# Patient Record
Sex: Female | Born: 1950 | Race: White | Hispanic: No | Marital: Single
Health system: Southern US, Community
[De-identification: ages and names within clinical notes are randomized; demographics above are authoritative.]

---

## 1991-07-30 HISTORY — PX: BREAST EXCISIONAL BIOPSY: SUR124

## 2006-02-12 ENCOUNTER — Ambulatory Visit: Payer: Self-pay

## 2006-07-28 ENCOUNTER — Emergency Department: Payer: Self-pay | Admitting: Emergency Medicine

## 2007-04-22 ENCOUNTER — Ambulatory Visit: Payer: Self-pay

## 2008-09-14 ENCOUNTER — Ambulatory Visit: Payer: Self-pay

## 2009-12-13 ENCOUNTER — Ambulatory Visit: Payer: Self-pay

## 2011-09-18 ENCOUNTER — Ambulatory Visit: Payer: Self-pay

## 2013-05-19 ENCOUNTER — Ambulatory Visit: Payer: Self-pay

## 2014-11-11 ENCOUNTER — Other Ambulatory Visit: Payer: Self-pay | Admitting: Oncology

## 2014-11-11 DIAGNOSIS — Z139 Encounter for screening, unspecified: Secondary | ICD-10-CM

## 2014-12-07 ENCOUNTER — Ambulatory Visit
Admission: RE | Admit: 2014-12-07 | Discharge: 2014-12-07 | Disposition: A | Discharge status: Home / Self Care | Payer: Self-pay | Source: Ambulatory Visit | Attending: Oncology | Admitting: Oncology

## 2014-12-07 ENCOUNTER — Inpatient Hospital Stay: Payer: Self-pay | Attending: Oncology

## 2014-12-07 VITALS — BP 149/85 | HR 73 | Temp 98.2°F | Resp 18 | Ht 65.35 in | Wt 163.6 lb

## 2014-12-07 DIAGNOSIS — Z139 Encounter for screening, unspecified: Secondary | ICD-10-CM

## 2014-12-07 DIAGNOSIS — Z0000 Encounter for general adult medical examination without abnormal findings: Secondary | ICD-10-CM

## 2014-12-07 NOTE — Progress Notes (Signed)
Subjective:     Patient ID: Meghan Becker, female   DOB: 12/05/1950, 64 y.o.   MRN: [id number]  HPI   Review of Systems     Objective:   Physical Exam  Pulmonary/Chest: Right breast exhibits no inverted nipple, no mass, no nipple discharge, no skin change and no tenderness. Left breast exhibits no inverted nipple, no mass, no nipple discharge, no skin change and no tenderness. Breasts are symmetrical.       Assessment:    64 yearold female presents for BCCCP screening.  CBE unremarkable.  Instructed on BSE using teach back.    .     Plan:       Sent for bilateral screening mammogram. Will follow per protocol.

## 2014-12-09 NOTE — Progress Notes (Signed)
Letter mailed from [facility] to notify of normal mammogram results.  Patient to return in one year for annual screening.   [facility] Breast Data sheet completed. 

## 2016-05-15 ENCOUNTER — Ambulatory Visit: Payer: Self-pay | Attending: Oncology

## 2016-05-15 ENCOUNTER — Ambulatory Visit
Admission: RE | Admit: 2016-05-15 | Discharge: 2016-05-15 | Disposition: A | Discharge status: Home / Self Care | Payer: Self-pay | Source: Ambulatory Visit | Attending: Oncology | Admitting: Oncology

## 2016-05-15 VITALS — BP 129/84 | HR 74 | Temp 97.7°F | Ht 64.57 in | Wt 156.5 lb

## 2016-05-15 DIAGNOSIS — Z0000 Encounter for general adult medical examination without abnormal findings: Secondary | ICD-10-CM

## 2016-05-15 NOTE — Progress Notes (Signed)
Subjective:     Patient ID: Meghan DageBarbara S Brozek, female   DOB: 09/02/1950, 65 y.o.   MRN: [id number]  HPI   Review of Systems     Objective:   Physical Exam  Pulmonary/Chest: Right breast exhibits no inverted nipple, no mass, no nipple discharge, no skin change and no tenderness. Left breast exhibits no inverted nipple, no mass, no nipple discharge, no skin change and no tenderness. Breasts are symmetrical.       Assessment:     65 year old patient returns for annual BCCCP screening.  Patient screened, and meets BCCCP eligibility.  Patient does not have insurance, Medicare or Medicaid.  Handout given on Affordable Care Act.  Instructed patient on breast self-exam using teach back method. Clinical breast exam normal. No mass or lump palpated. 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     Plan:     Sent for bilateral screening mammogram.

## 2016-05-16 NOTE — Progress Notes (Signed)
Letter mailed from [facility] to notify of normal mammogram results.  Patient to return in one year for annual screening.  Copy to [facility]. 

## 2017-12-08 ENCOUNTER — Other Ambulatory Visit: Payer: Self-pay | Admitting: Primary Care

## 2017-12-08 DIAGNOSIS — R112 Nausea with vomiting, unspecified: Secondary | ICD-10-CM

## 2017-12-08 DIAGNOSIS — R1084 Generalized abdominal pain: Secondary | ICD-10-CM

## 2017-12-10 ENCOUNTER — Ambulatory Visit: Payer: Self-pay

## 2017-12-17 ENCOUNTER — Ambulatory Visit
Admission: RE | Admit: 2017-12-17 | Discharge: 2017-12-17 | Disposition: A | Discharge status: Home / Self Care | Payer: Medicare Other | Source: Ambulatory Visit | Attending: Primary Care | Admitting: Primary Care

## 2017-12-17 DIAGNOSIS — R112 Nausea with vomiting, unspecified: Secondary | ICD-10-CM

## 2017-12-17 DIAGNOSIS — R1084 Generalized abdominal pain: Secondary | ICD-10-CM | POA: Diagnosis present

## 2017-12-17 DIAGNOSIS — K769 Liver disease, unspecified: Secondary | ICD-10-CM | POA: Insufficient documentation

## 2018-09-10 ENCOUNTER — Other Ambulatory Visit: Payer: Self-pay | Admitting: Primary Care

## 2018-09-10 DIAGNOSIS — Z1231 Encounter for screening mammogram for malignant neoplasm of breast: Secondary | ICD-10-CM

## 2018-09-17 ENCOUNTER — Other Ambulatory Visit: Payer: Self-pay | Admitting: Primary Care

## 2018-09-17 ENCOUNTER — Other Ambulatory Visit (HOSPITAL_COMMUNITY): Payer: Self-pay | Admitting: Primary Care

## 2018-09-17 DIAGNOSIS — R2990 Unspecified symptoms and signs involving the nervous system: Secondary | ICD-10-CM

## 2018-09-30 ENCOUNTER — Ambulatory Visit
Admission: RE | Admit: 2018-09-30 | Discharge: 2018-09-30 | Disposition: A | Discharge status: Home / Self Care | Payer: Medicare Other | Source: Ambulatory Visit | Attending: Primary Care | Admitting: Primary Care

## 2018-09-30 DIAGNOSIS — Z1231 Encounter for screening mammogram for malignant neoplasm of breast: Secondary | ICD-10-CM | POA: Diagnosis present

## 2019-02-17 ENCOUNTER — Encounter: Payer: Self-pay | Admitting: Gastroenterology

## 2019-07-16 IMAGING — MG DIGITAL SCREENING BILATERAL MAMMOGRAM WITH TOMO AND CAD
8 series · 8 of 24 positions shown · non-contrast
Comparison: Previous exam(s).

CLINICAL DATA: Screening.

EXAM:
DIGITAL SCREENING BILATERAL MAMMOGRAM WITH TOMO AND CAD

[L MLO synth-2D]
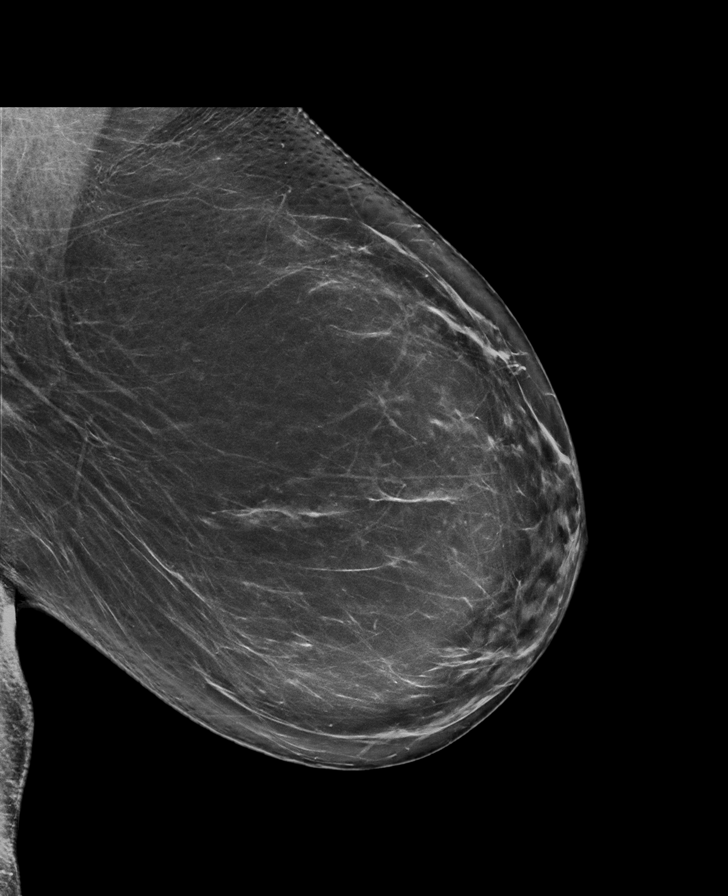

[R CC synth-2D]
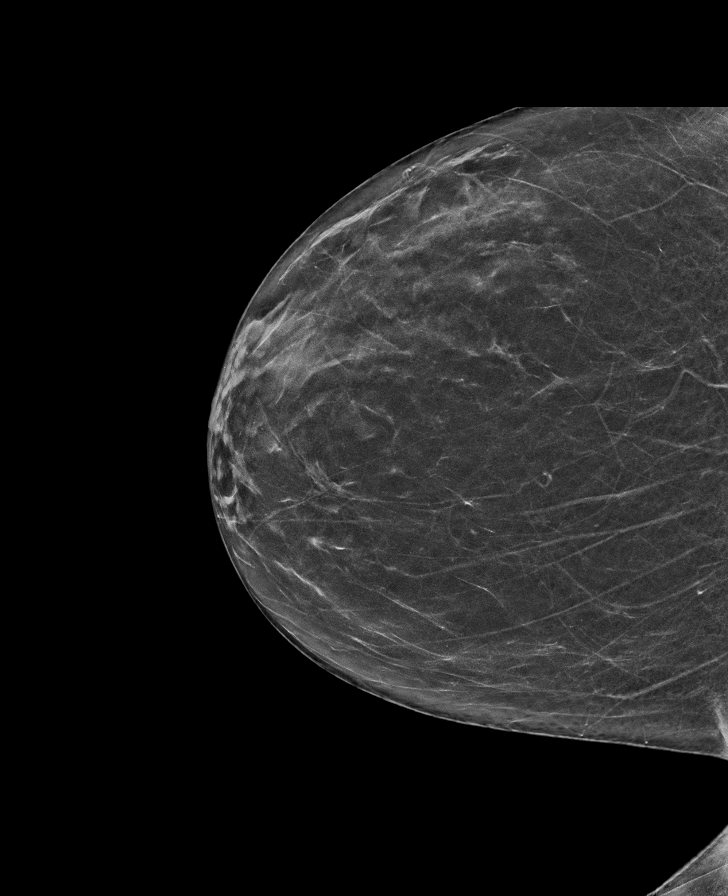

[R MLO synth-2D]
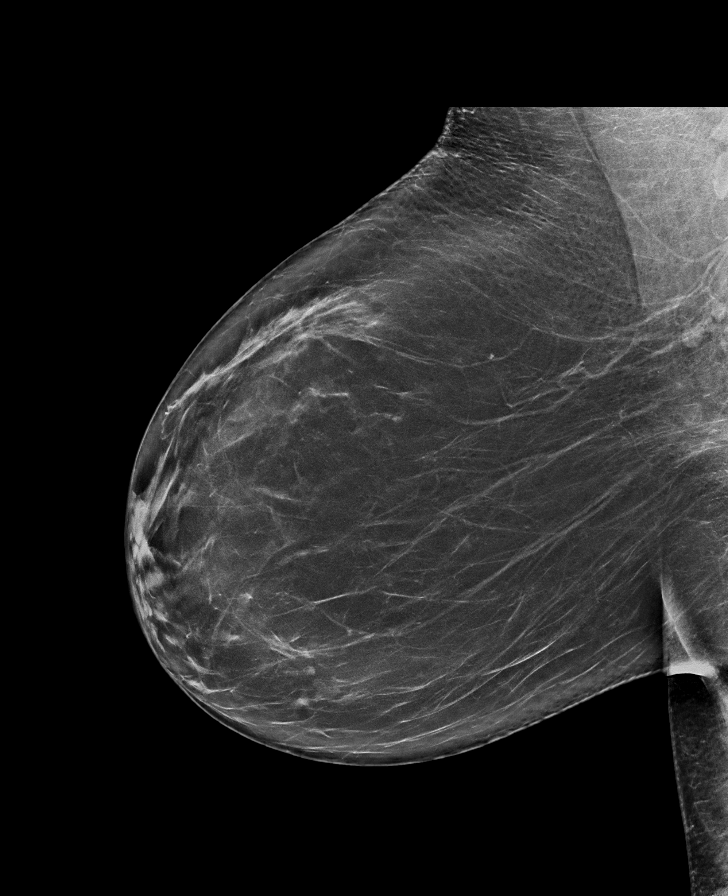

[L CC synth-2D]
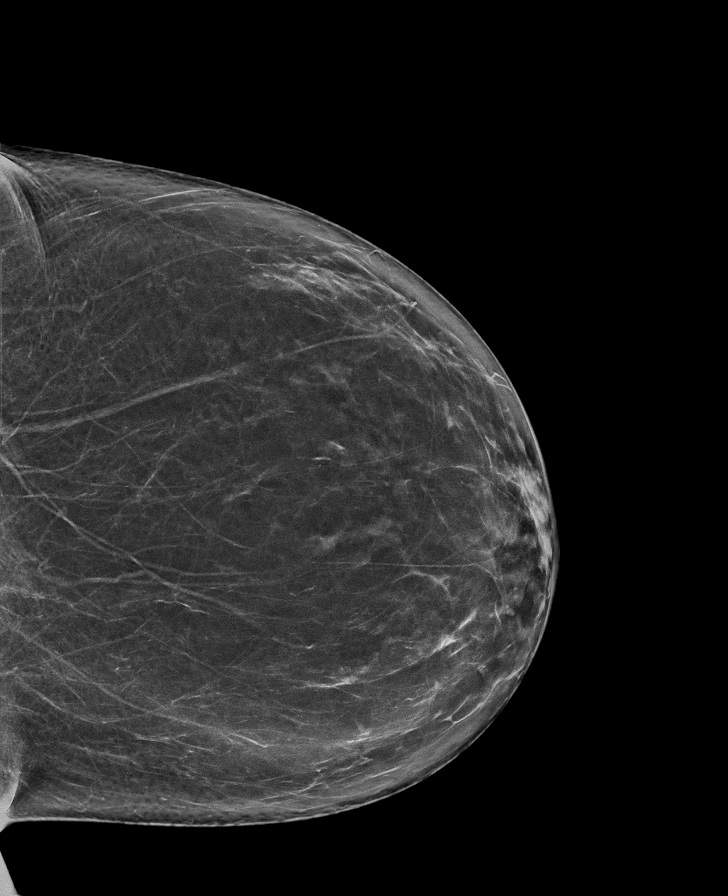

[R MLO tomo · tomo slice 40/79.0]
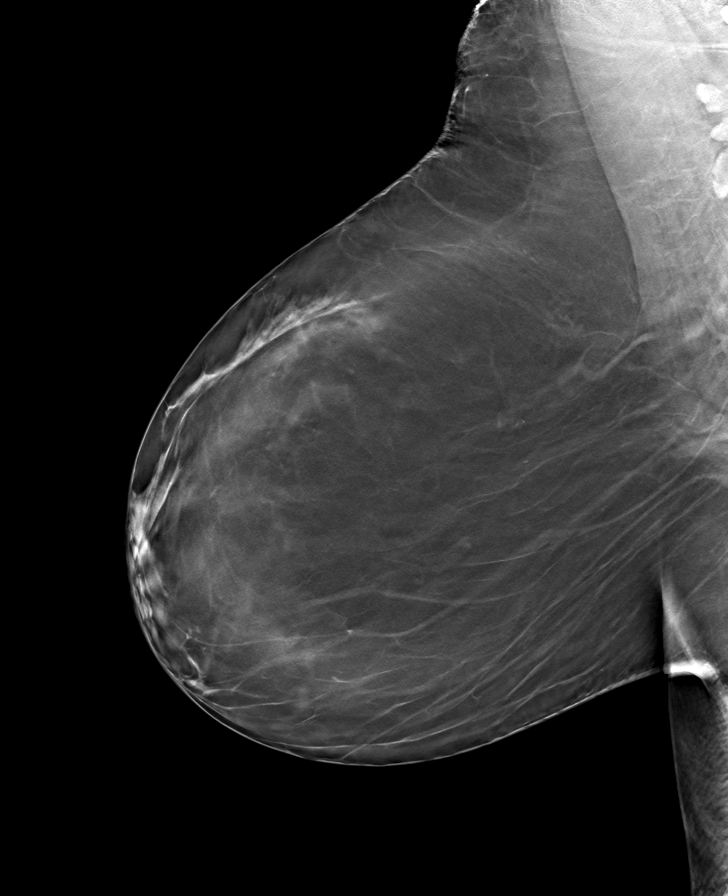

[L MLO tomo · tomo slice 40/79.0]
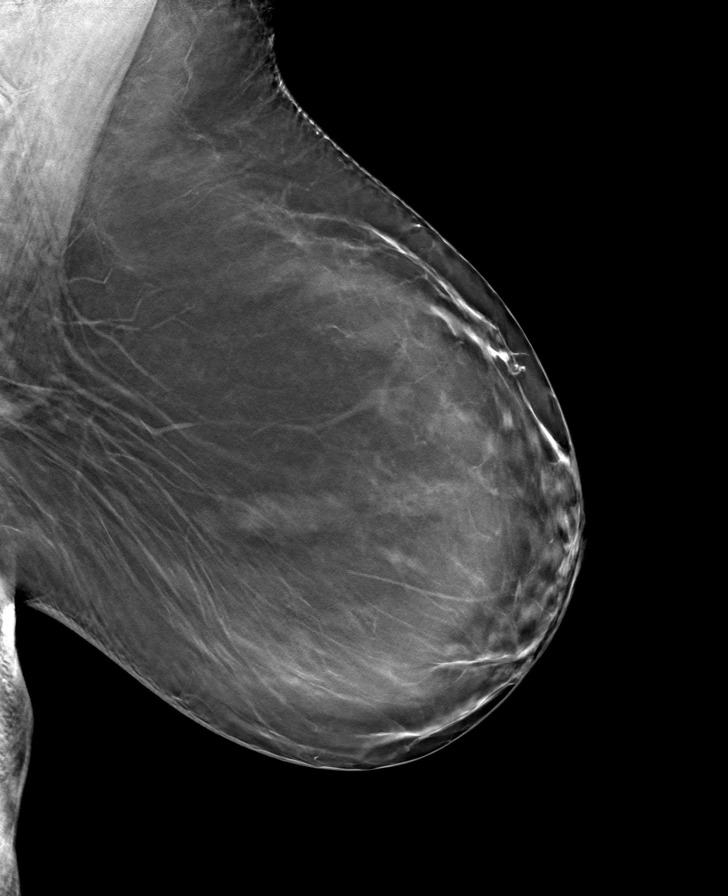

[L CC tomo · tomo slice 37/72.0]
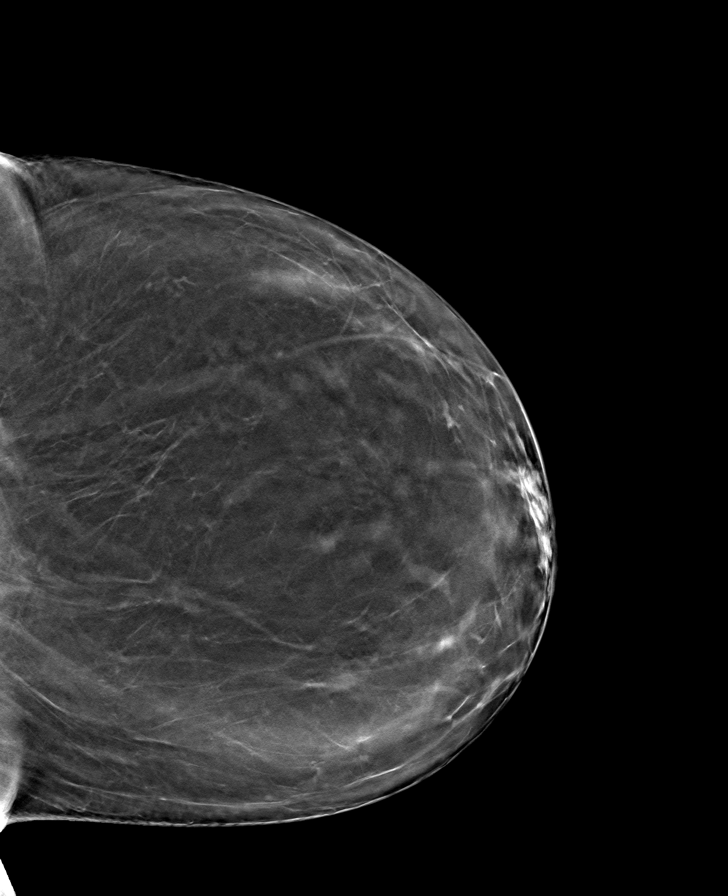

[R CC tomo · tomo slice 33/65.0]
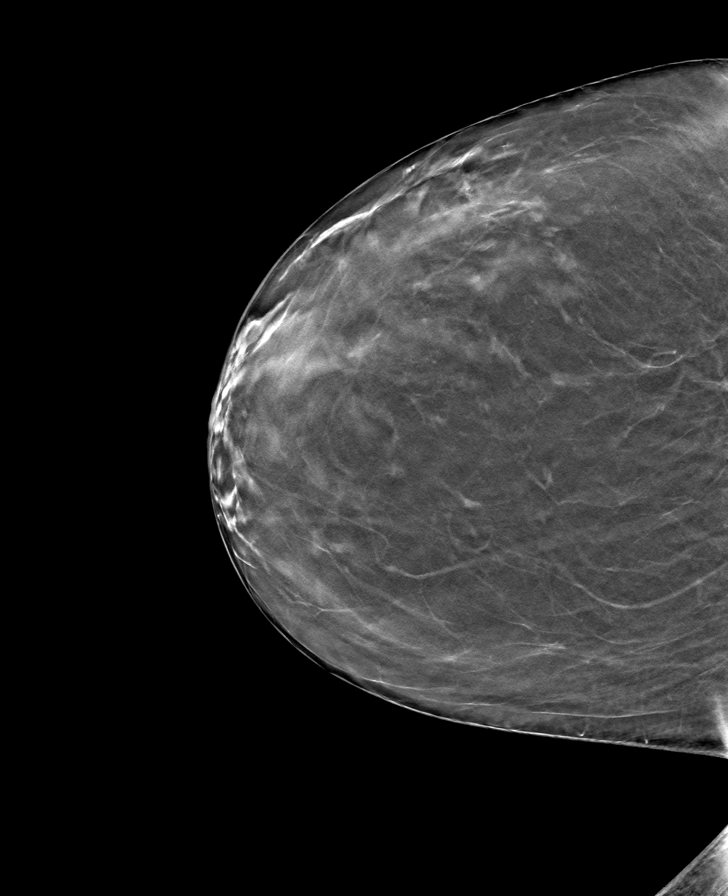

[8 of 24 positions shown; findings below may reference images not displayed]

ACR Breast Density Category b: There are scattered areas of
fibroglandular density.
FINDINGS: There are no findings suspicious for malignancy. Images were
processed with CAD.
IMPRESSION: No mammographic evidence of malignancy. A result letter of this
screening mammogram will be mailed directly to the patient.

RECOMMENDATION:
Screening mammogram in one year. (Code:[id number])

BI-RADS CATEGORY  1: Negative.

## 2021-05-21 ENCOUNTER — Other Ambulatory Visit: Payer: Self-pay | Admitting: Primary Care

## 2021-05-21 DIAGNOSIS — Z1231 Encounter for screening mammogram for malignant neoplasm of breast: Secondary | ICD-10-CM

## 2021-07-04 ENCOUNTER — Ambulatory Visit
Admission: RE | Admit: 2021-07-04 | Discharge: 2021-07-04 | Disposition: A | Discharge status: Home / Self Care | Payer: Medicare Other | Source: Ambulatory Visit | Attending: Primary Care | Admitting: Primary Care

## 2021-07-04 ENCOUNTER — Other Ambulatory Visit: Payer: Self-pay

## 2021-07-04 DIAGNOSIS — Z1231 Encounter for screening mammogram for malignant neoplasm of breast: Secondary | ICD-10-CM | POA: Diagnosis not present
